# Patient Record
Sex: Male | Born: 1985 | Race: Black or African American | Hispanic: No | Smoking: Current every day smoker
Health system: Southern US, Community
[De-identification: ages and names within clinical notes are randomized; demographics above are authoritative.]

## PROBLEM LIST (undated history)

## (undated) DIAGNOSIS — J45909 Unspecified asthma, uncomplicated: Secondary | ICD-10-CM

---

## 2011-03-03 ENCOUNTER — Emergency Department (HOSPITAL_COMMUNITY): Payer: Self-pay

## 2011-03-03 ENCOUNTER — Other Ambulatory Visit: Payer: Self-pay

## 2011-03-03 ENCOUNTER — Emergency Department (HOSPITAL_COMMUNITY)
Admission: EM | Admit: 2011-03-03 | Discharge: 2011-03-03 | Disposition: A | Discharge status: Home / Self Care | Payer: Self-pay | Attending: Emergency Medicine | Admitting: Emergency Medicine

## 2011-03-03 ENCOUNTER — Encounter: Payer: Self-pay | Admitting: *Deleted

## 2011-03-03 DIAGNOSIS — R0602 Shortness of breath: Secondary | ICD-10-CM | POA: Insufficient documentation

## 2011-03-03 DIAGNOSIS — J069 Acute upper respiratory infection, unspecified: Secondary | ICD-10-CM | POA: Insufficient documentation

## 2011-03-03 DIAGNOSIS — R059 Cough, unspecified: Secondary | ICD-10-CM | POA: Insufficient documentation

## 2011-03-03 DIAGNOSIS — R079 Chest pain, unspecified: Secondary | ICD-10-CM | POA: Insufficient documentation

## 2011-03-03 DIAGNOSIS — R05 Cough: Secondary | ICD-10-CM | POA: Insufficient documentation

## 2011-03-03 LAB — CBC
HCT: 37.8 % — ABNORMAL LOW (ref 39.0–52.0)
MCH: 29.3 pg (ref 26.0–34.0)
MCHC: 34.9 g/dL (ref 30.0–36.0)
MCV: 84 fL (ref 78.0–100.0)
RDW: 12.3 % (ref 11.5–15.5)

## 2011-03-03 LAB — POCT I-STAT TROPONIN I

## 2011-03-03 LAB — POCT I-STAT, CHEM 8
Calcium, Ion: 1.11 mmol/L — ABNORMAL LOW (ref 1.12–1.32)
Chloride: 106 mEq/L (ref 96–112)
Glucose, Bld: 104 mg/dL — ABNORMAL HIGH (ref 70–99)
HCT: 39 % (ref 39.0–52.0)
TCO2: 25 mmol/L (ref 0–100)

## 2011-03-03 LAB — DIFFERENTIAL
Basophils Absolute: 0 10*3/uL (ref 0.0–0.1)
Basophils Relative: 1 % (ref 0–1)
Eosinophils Absolute: 0.2 10*3/uL (ref 0.0–0.7)
Eosinophils Relative: 2 % (ref 0–5)
Lymphocytes Relative: 38 % (ref 12–46)
Monocytes Absolute: 0.8 10*3/uL (ref 0.1–1.0)

## 2011-03-03 MED ORDER — ALBUTEROL SULFATE HFA 108 (90 BASE) MCG/ACT IN AERS
2.0000 | INHALATION_SPRAY | RESPIRATORY_TRACT | Status: DC | PRN
  Administered 2011-03-03: 2 via RESPIRATORY_TRACT
  Filled 2011-03-03: qty 6.7

## 2011-03-03 MED ORDER — PREDNISONE 20 MG PO TABS
60.0000 mg | ORAL_TABLET | Freq: Once | ORAL | Status: AC
  Administered 2011-03-03: 60 mg via ORAL
  Filled 2011-03-03: qty 3

## 2011-03-03 MED ORDER — ALBUTEROL SULFATE HFA 108 (90 BASE) MCG/ACT IN AERS: 2.0000 | INHALATION_SPRAY | RESPIRATORY_TRACT | Status: AC | PRN

## 2011-03-03 MED ORDER — POTASSIUM CHLORIDE CRYS ER 20 MEQ PO TBCR
20.0000 meq | EXTENDED_RELEASE_TABLET | Freq: Once | ORAL | Status: AC
  Administered 2011-03-03: 20 meq via ORAL
  Filled 2011-03-03: qty 1

## 2011-03-03 MED ORDER — PREDNISONE 20 MG PO TABS: 60.0000 mg | ORAL_TABLET | Freq: Every day | ORAL | Status: AC

## 2011-03-03 NOTE — ED Notes (Signed)
EKG completed and given to Dr. Dierdre Highman.

## 2011-03-03 NOTE — ED Notes (Signed)
Pt called EMS for sob (feeling like he could not get a "full" breath).  Upon EMS arrival pt stated that he had been smoking pot and that he no longer wanted to go to the hospital.  Denies cocaine or etoh abuse.  However, EMS ekg showed possible st elevations and pt decided he wanted to come to ED.  Denies pain, just complaining of sob.  Per EMS, no wheezes noted.

## 2011-03-03 NOTE — ED Provider Notes (Signed)
History     CSN: [id number] Arrival date & time: 03/03/2011  1:35 AM   First MD Initiated Contact with Patient 03/03/11 [phone]      Chief Complaint  Patient presents with  . Shortness of Breath    (Consider location/radiation/quality/duration/timing/severity/associated sxs/prior treatment) Patient is a 25 y.o. male presenting with shortness of breath. The history is provided by the patient.  Shortness of Breath  The current episode started today. The problem occurs continuously. The problem has been resolved. The problem is moderate. The symptoms are relieved by nothing. Exacerbated by: started after smoking marijuana tonight. Associated symptoms include chest pain, cough and shortness of breath. Pertinent negatives include no chest pressure, no fever, no sore throat and no wheezing. He has had no prior steroid use. He has had no prior hospitalizations. He has had no prior ICU admissions. He has had no prior intubations. His past medical history does not include asthma. Past medical history comments: Recently diagnosed bronchitis and did not finish his prescription for amoxicillin . There were no sick contacts.   substernal sharp chest pain nonradiating. Mild productive cough no hemoptysis. No leg pain or swelling. No recent travel.   History reviewed. No pertinent past medical history.  History reviewed. No pertinent past surgical history.  No family history on file.  History  Substance Use Topics  . Smoking status: Not on file  . Smokeless tobacco: Not on file  . Alcohol Use: No      Review of Systems  Constitutional: Negative for fever and chills.  HENT: Negative for sore throat, neck pain and neck stiffness.   Eyes: Negative for pain.  Respiratory: Positive for cough and shortness of breath. Negative for wheezing.   Cardiovascular: Positive for chest pain. Negative for palpitations and leg swelling.  Gastrointestinal: Negative for nausea, vomiting and abdominal pain.    Genitourinary: Negative for dysuria.  Musculoskeletal: Negative for back pain.  Skin: Negative for rash.  Neurological: Negative for headaches.  All other systems reviewed and are negative.    Allergies  Review of patient's allergies indicates no known allergies.  Home Medications   Current Outpatient Rx  Name Route Sig Dispense Refill  . TRAMADOL HCL 50 MG PO TABS Oral Take 50 mg by mouth every 6 (six) hours as needed. For pain Maximum dose= 8 tablets per day       BP 104/46  Pulse 76  Temp(Src) 98.4 F (36.9 C) (Oral)  Resp 20  Ht 6\' 3"  (1.905 m)  Wt 185 lb (83.915 kg)  BMI 23.12 kg/m2  SpO2 98%  Physical Exam  Constitutional: He is oriented to person, place, and time. He appears well-developed and well-nourished.  HENT:  Head: Normocephalic and atraumatic.  Eyes: Conjunctivae and EOM are normal. Pupils are equal, round, and reactive to light.  Neck: Trachea normal. Neck supple. No thyromegaly present.  Cardiovascular: Normal rate, regular rhythm, S1 normal, S2 normal and normal pulses.     No systolic murmur is present   No diastolic murmur is present  Pulses:      Radial pulses are 2+ on the right side, and 2+ on the left side.  Pulmonary/Chest: Effort normal and breath sounds normal. He has no wheezes. He has no rhonchi. He has no rales. He exhibits no tenderness.  Abdominal: Soft. Normal appearance and bowel sounds are normal. There is no tenderness. There is no CVA tenderness and negative Murphy's sign.  Musculoskeletal:       BLE:s Calves nontender, no cords  or erythema, negative Homans sign  Neurological: He is alert and oriented to person, place, and time. He has normal strength. No cranial nerve deficit or sensory deficit. GCS eye subscore is 4. GCS verbal subscore is 5. GCS motor subscore is 6.  Skin: Skin is warm and dry. No rash noted. He is not diaphoretic.  Psychiatric: His speech is normal.       Cooperative and appropriate    ED Course   Procedures (including critical care time)  Labs Reviewed  CBC - Abnormal; Notable for the following:    HCT 37.8 (*)    All other components within normal limits  POCT I-STAT, CHEM 8 - Abnormal; Notable for the following:    Potassium 3.4 (*)    Glucose, Bld 104 (*)    Calcium, Ion 1.11 (*)    All other components within normal limits  DIFFERENTIAL  D-DIMER, QUANTITATIVE  POCT I-STAT TROPONIN I  POCT I-STAT TROPONIN I  I-STAT, CHEM 8  I-STAT TROPONIN I  I-STAT TROPONIN I   Dg Chest 2 View  03/03/2011  *RADIOLOGY REPORT*  Clinical Data: Shortness of breath and cough.  CHEST - 2 VIEW  Comparison: 12/13/2010  Findings: The lungs are clear without focal consolidation, edema, effusion or pneumothorax.  Cardiopericardial silhouette is within normal limits for size.  Imaged bony structures of the thorax are intact. Telemetry leads overlie the chest.  IMPRESSION: Normal exam.  Original Report Authenticated By: ERIC A. MANSELL, M.D.     Date: 03/03/2011  Rate: 49  Rhythm: sinus bradycardia  QRS Axis: normal  Intervals: normal  ST/T Wave abnormalities: ST elevations diffusely  Conduction Disutrbances:none  Narrative Interpretation:   Old EKG Reviewed: none available     MDM  Cough and chest pain or shortness of breath consistent with URI, screening EKG demonstrates ST changes likely early repol. Serial troponins negative and symptoms resolved emergency department with inhaler. Chest x-ray obtained and reviewed no infiltrates or pneumothorax. Albuterol and prednisone for symptoms and prescription for same provided patient stable for discharge home.        Sunnie Nielsen, MD 03/03/11 [phone]

## 2011-03-03 NOTE — ED Notes (Signed)
Pt notified that D-Dimer has been ordered.

## 2011-03-03 NOTE — ED Notes (Signed)
Pt denies pain but states sob and chest tightness.  Lung sounds clear bil.  Denies any medical hx.

## 2011-03-03 NOTE — ED Notes (Signed)
Pt denies any pain or sob at this time.  He does not know where he came from b/c he is visiting from [place] (he has been here 2 months).  He is trying to call family.

## 2017-05-30 ENCOUNTER — Emergency Department (HOSPITAL_BASED_OUTPATIENT_CLINIC_OR_DEPARTMENT_OTHER)
Admission: EM | Admit: 2017-05-30 | Discharge: 2017-05-31 | Disposition: A | Discharge status: Home / Self Care | Payer: Self-pay | Attending: Emergency Medicine | Admitting: Emergency Medicine

## 2017-05-30 ENCOUNTER — Encounter (HOSPITAL_BASED_OUTPATIENT_CLINIC_OR_DEPARTMENT_OTHER): Payer: Self-pay | Admitting: *Deleted

## 2017-05-30 ENCOUNTER — Other Ambulatory Visit: Payer: Self-pay

## 2017-05-30 ENCOUNTER — Emergency Department (HOSPITAL_BASED_OUTPATIENT_CLINIC_OR_DEPARTMENT_OTHER): Payer: Self-pay

## 2017-05-30 DIAGNOSIS — F17210 Nicotine dependence, cigarettes, uncomplicated: Secondary | ICD-10-CM | POA: Insufficient documentation

## 2017-05-30 DIAGNOSIS — R222 Localized swelling, mass and lump, trunk: Secondary | ICD-10-CM | POA: Insufficient documentation

## 2017-05-30 DIAGNOSIS — R51 Headache: Secondary | ICD-10-CM | POA: Insufficient documentation

## 2017-05-30 DIAGNOSIS — F1210 Cannabis abuse, uncomplicated: Secondary | ICD-10-CM | POA: Insufficient documentation

## 2017-05-30 DIAGNOSIS — R0981 Nasal congestion: Secondary | ICD-10-CM | POA: Insufficient documentation

## 2017-05-30 DIAGNOSIS — R0602 Shortness of breath: Secondary | ICD-10-CM

## 2017-05-30 LAB — CBC WITH DIFFERENTIAL/PLATELET
BASOS ABS: 0.1 10*3/uL (ref 0.0–0.1)
BASOS PCT: 1 %
Eosinophils Absolute: 0.1 10*3/uL (ref 0.0–0.7)
Eosinophils Relative: 2 %
HEMATOCRIT: 43.1 % (ref 39.0–52.0)
HEMOGLOBIN: 14.6 g/dL (ref 13.0–17.0)
Lymphocytes Relative: 42 %
Lymphs Abs: 2.9 10*3/uL (ref 0.7–4.0)
MCH: 29.9 pg (ref 26.0–34.0)
MCHC: 33.9 g/dL (ref 30.0–36.0)
MCV: 88.1 fL (ref 78.0–100.0)
Monocytes Absolute: 0.6 10*3/uL (ref 0.1–1.0)
Monocytes Relative: 9 %
NEUTROS ABS: 3.2 10*3/uL (ref 1.7–7.7)
NEUTROS PCT: 46 %
Platelets: 229 10*3/uL (ref 150–400)
RBC: 4.89 MIL/uL (ref 4.22–5.81)
RDW: 12.8 % (ref 11.5–15.5)
WBC: 6.9 10*3/uL (ref 4.0–10.5)

## 2017-05-30 LAB — BASIC METABOLIC PANEL
ANION GAP: 8 (ref 5–15)
BUN: 9 mg/dL (ref 6–20)
CHLORIDE: 103 mmol/L (ref 101–111)
CO2: 28 mmol/L (ref 22–32)
Calcium: 8.9 mg/dL (ref 8.9–10.3)
Creatinine, Ser: 1.01 mg/dL (ref 0.61–1.24)
GFR calc non Af Amer: >60 mL/min (ref >60)
Glucose, Bld: 83 mg/dL (ref 65–99)
POTASSIUM: 3.8 mmol/L (ref 3.5–5.1)
Sodium: 139 mmol/L (ref 135–145)

## 2017-05-30 MED ORDER — ALBUTEROL SULFATE HFA 108 (90 BASE) MCG/ACT IN AERS
2.0000 | INHALATION_SPRAY | RESPIRATORY_TRACT | Status: DC | PRN
  Administered 2017-05-31: 2 via RESPIRATORY_TRACT
  Filled 2017-05-30: qty 6.7

## 2017-05-30 NOTE — ED Provider Notes (Signed)
[facility] EMERGENCY DEPARTMENT Provider Note   CSN: [id number] Arrival date & time: 05/30/17  1911     History   Chief Complaint No chief complaint on file.   HPI Dvontae Ruan is a 32 y.o. male.  Patient is a 32 year old male who presents with multiple symptoms.  He states over the last month or so he has had some intermittent symptoms of shortness of breath.  He feels like he is having some palpitations at times.  He does have a prior history of asthma and feels like it might be related to that although he does not note any definitive wheezing.  He does have some nasal congestion at times and has some intermittent bifrontal type headaches.  No fevers.  No difficulty with his balance.  He has no symptoms when exercising.  He states he is able to play basketball without any shortness of breath or chest pain.  He also notes that he has a lump on his left chest wall that is been there several months.  He does not notice any enlargement.  It is not tender.      History reviewed. No pertinent past medical history.  There are no active problems to display for this patient.   History reviewed. No pertinent surgical history.     Home Medications    Prior to Admission medications   Medication Sig Start Date End Date Taking? Authorizing Provider  albuterol (PROVENTIL HFA;VENTOLIN HFA) 108 (90 BASE) MCG/ACT inhaler Inhale 2 puffs into the lungs every 4 (four) hours as needed for wheezing. 03/03/11 03/02/12  Sunnie Nielsen, MD  traMADol (ULTRAM) 50 MG tablet Take 50 mg by mouth every 6 (six) hours as needed. For pain Maximum dose= 8 tablets per day     [provider]    Family History No family history on file.  Social History Social History   Tobacco Use  . Smoking status: Current Every Day Smoker    Years: 5.00  . Smokeless tobacco: Never Used  Substance Use Topics  . Alcohol use: No  . Drug use: Yes    Types: Marijuana     Allergies   Patient  has no known allergies.   Review of Systems Review of Systems  Constitutional: Negative for chills, diaphoresis, fatigue and fever.  HENT: Positive for congestion. Negative for rhinorrhea and sneezing.   Eyes: Negative.   Respiratory: Positive for shortness of breath. Negative for cough and chest tightness.   Cardiovascular: Positive for palpitations. Negative for chest pain and leg swelling.  Gastrointestinal: Negative for abdominal pain, blood in stool, diarrhea, nausea and vomiting.  Genitourinary: Negative for difficulty urinating, flank pain, frequency and hematuria.  Musculoskeletal: Negative for arthralgias and back pain.  Skin: Negative for rash.  Neurological: Positive for headaches. Negative for dizziness, speech difficulty, weakness and numbness.     Physical Exam Updated Vital Signs BP 137/85 (BP Location: Left Arm)   Pulse (!) 59   Temp 98.4 F (36.9 C) (Oral)   Resp 15   Ht 6\' 4"  (1.93 m)   Wt 86.2 kg (190 lb)   SpO2 99%   BMI 23.13 kg/m   Physical Exam  Constitutional: He is oriented to person, place, and time. He appears well-developed and well-nourished.  HENT:  Head: Normocephalic and atraumatic.  Mouth/Throat: Oropharynx is clear and moist.  Eyes: Pupils are equal, round, and reactive to light.  Neck: Normal range of motion. Neck supple.  Cardiovascular: Normal rate, regular rhythm and normal heart  sounds.  Patient is a 2 cm soft mobile mass to his left chest wall just lateral to the sternum.  There is no erythema.  No tenderness.  Pulmonary/Chest: Effort normal and breath sounds normal. No respiratory distress. He has no wheezes. He has no rales. He exhibits no tenderness.  Abdominal: Soft. Bowel sounds are normal. There is no tenderness. There is no rebound and no guarding.  Musculoskeletal: Normal range of motion. He exhibits no edema.  No edema or calf tenderness  Lymphadenopathy:    He has no cervical adenopathy.  Neurological: He is alert and  oriented to person, place, and time.  Skin: Skin is warm and dry. No rash noted.  Psychiatric: He has a normal mood and affect.     ED Treatments / Results  Labs (all labs ordered are listed, but only abnormal results are displayed) Labs Reviewed  CBC WITH DIFFERENTIAL/PLATELET  BASIC METABOLIC PANEL    EKG  EKG Interpretation None     ED ECG REPORT   Date: 05/30/2017  Rate: 75  Rhythm: normal sinus rhythm  QRS Axis: normal  Intervals: normal  ST/T Wave abnormalities: ST elevations diffusely, similar to prior EKG  Conduction Disutrbances:none  Narrative Interpretation:   Old EKG Reviewed: unchanged  I have personally reviewed the EKG tracing and agree with the computerized printout as noted.   Radiology Dg Chest 2 View  Result Date: 05/30/2017 CLINICAL DATA:  Cough and dyspnea x1 day. EXAM: CHEST - 2 VIEW COMPARISON:  04/19/2016 FINDINGS: The heart size and mediastinal contours are within normal limits. Both lungs are clear. The visualized skeletal structures are unremarkable. IMPRESSION: No active cardiopulmonary disease. Electronically Signed   By: Tollie Eth M.D.   On: 05/30/2017 23:25    Procedures Procedures (including critical care time)  Medications Ordered in ED Medications  albuterol (PROVENTIL HFA;VENTOLIN HFA) 108 (90 Base) MCG/ACT inhaler 2 puff (not administered)     Initial Impression / Assessment and Plan / ED Course  I have reviewed the triage vital signs and the nursing notes.  Pertinent labs & imaging results that were available during my care of the patient were reviewed by me and considered in my medical decision making (see chart for details).     Patient is a 32 year old male who presents with various complaints.  He has a lump on his chest wall.  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  It is nontender.  There is no suggestions of infection.  I did advise him that I can refer him to [facility] [place] [facility] if  he should want it removed or if it becomes painful or enlarging.  He also complains of some intermittent symptoms of shortness of breath.  It is not exertional.  He has some palpitations at times.  He does not have any abnormal findings on EKG.  Chest x-ray is clear without suggestions of pneumonia.  His labs are within normal limits.  He does have a prior history of asthma and he smokes.  He may have some degree of reactive airway disease.  I dispensed some albuterol inhaler to use.  His lungs are currently clear.  He denies any current symptoms.  He has no hypoxia or tachycardia.  No other suggestions of pulmonary embolus.  No evidence of pneumonia.  He was discharged home in good condition.  He was encouraged to establish care with a primary care physician.  Return precautions were given.  Final Clinical Impressions(s) / ED  Diagnoses   Final diagnoses:  Chest wall mass  Shortness of breath    ED Discharge Orders    None       Rolan BuccoBelfi, Vincenzo Stave, MD 05/30/17 2353

## 2017-05-30 NOTE — ED Triage Notes (Signed)
Knot in his left chest for a month. He was seen at [facility] when he first found it and they told him it was a benign tumor. He was not given follow up. He wants a second opinion.

## 2019-04-07 IMAGING — CR DG CHEST 2V
2 series · 2 of 2 positions shown · non-contrast
Comparison: 04/19/2016

CLINICAL DATA: Cough and dyspnea x1 day.

EXAM:
CHEST - 2 VIEW

[w chest pa]
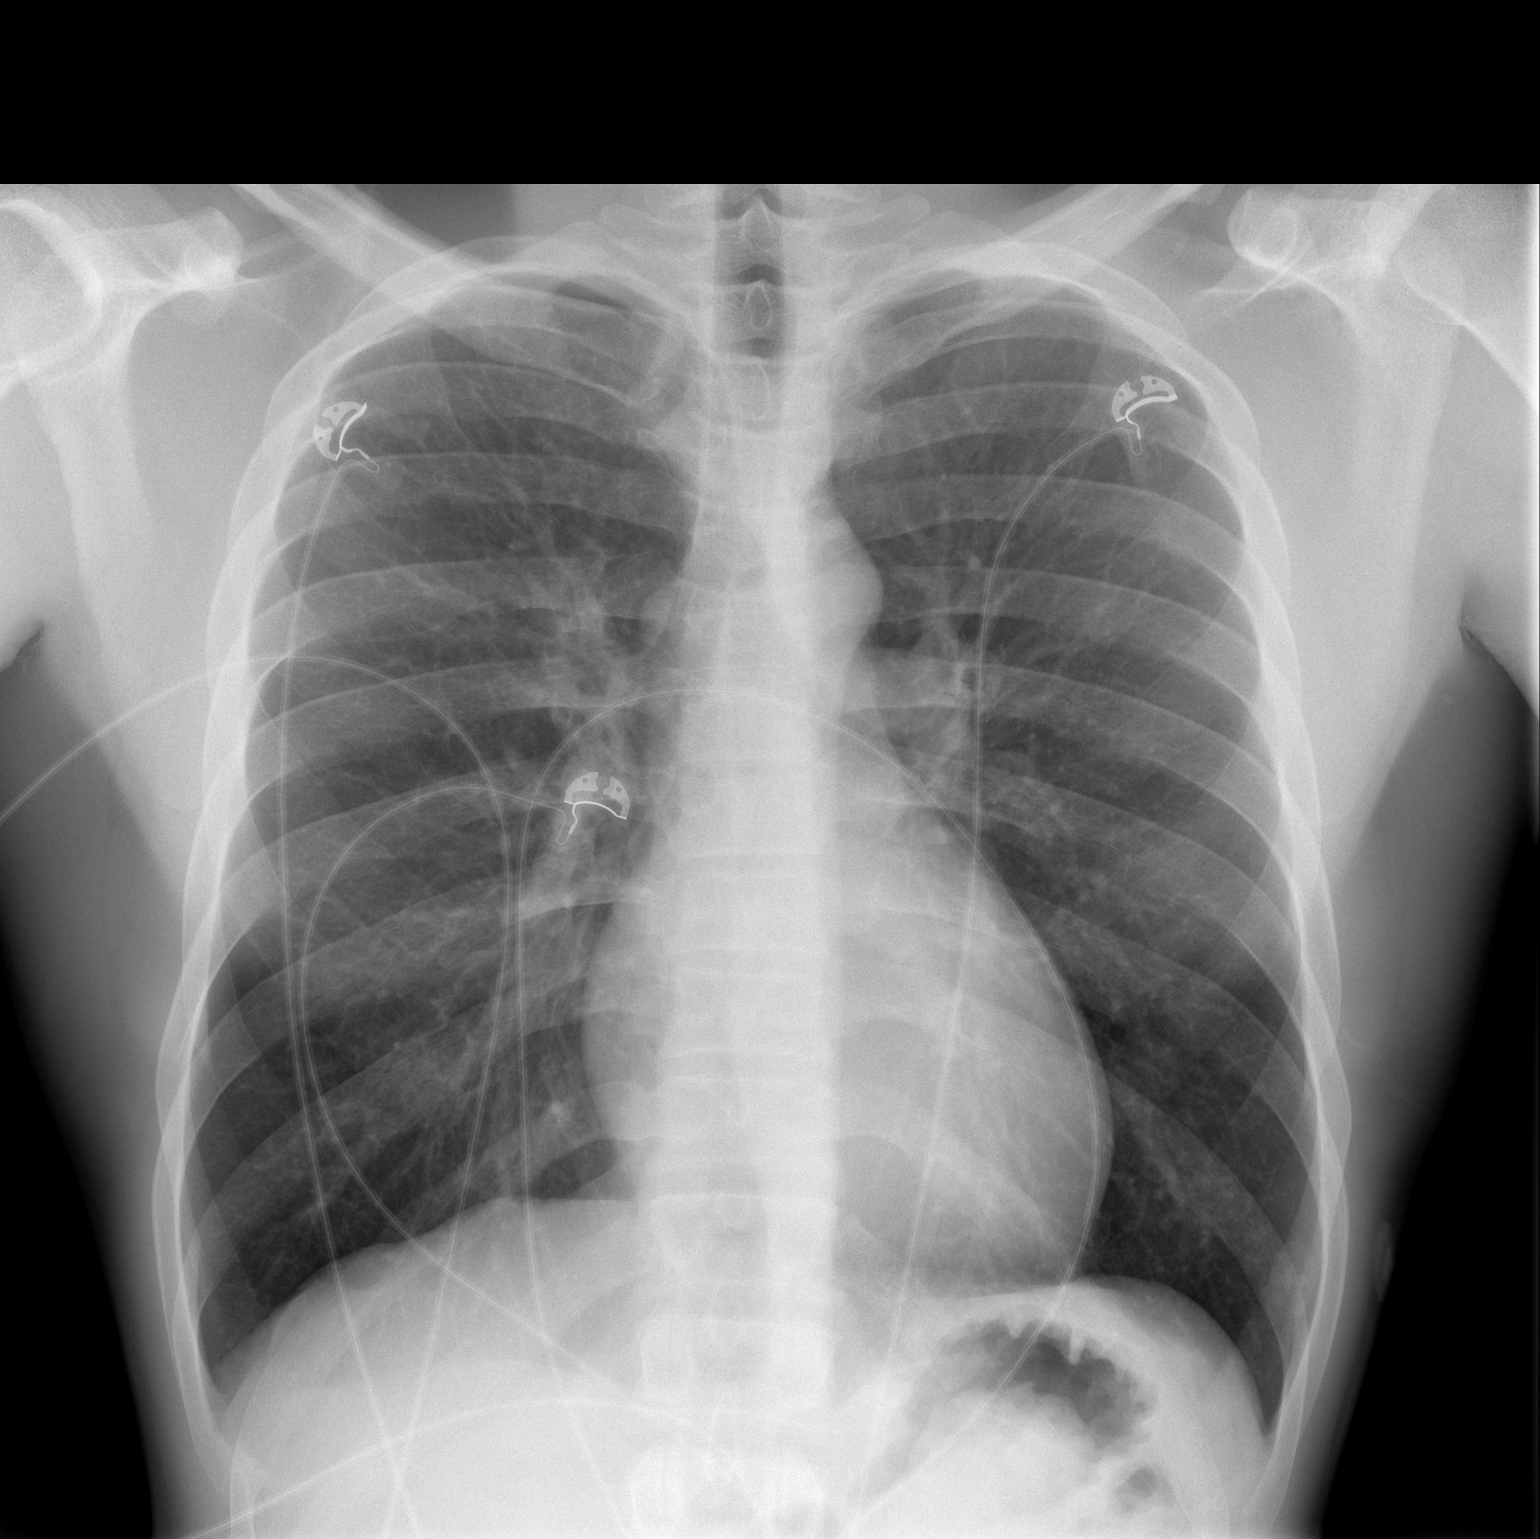

[w chest lat]
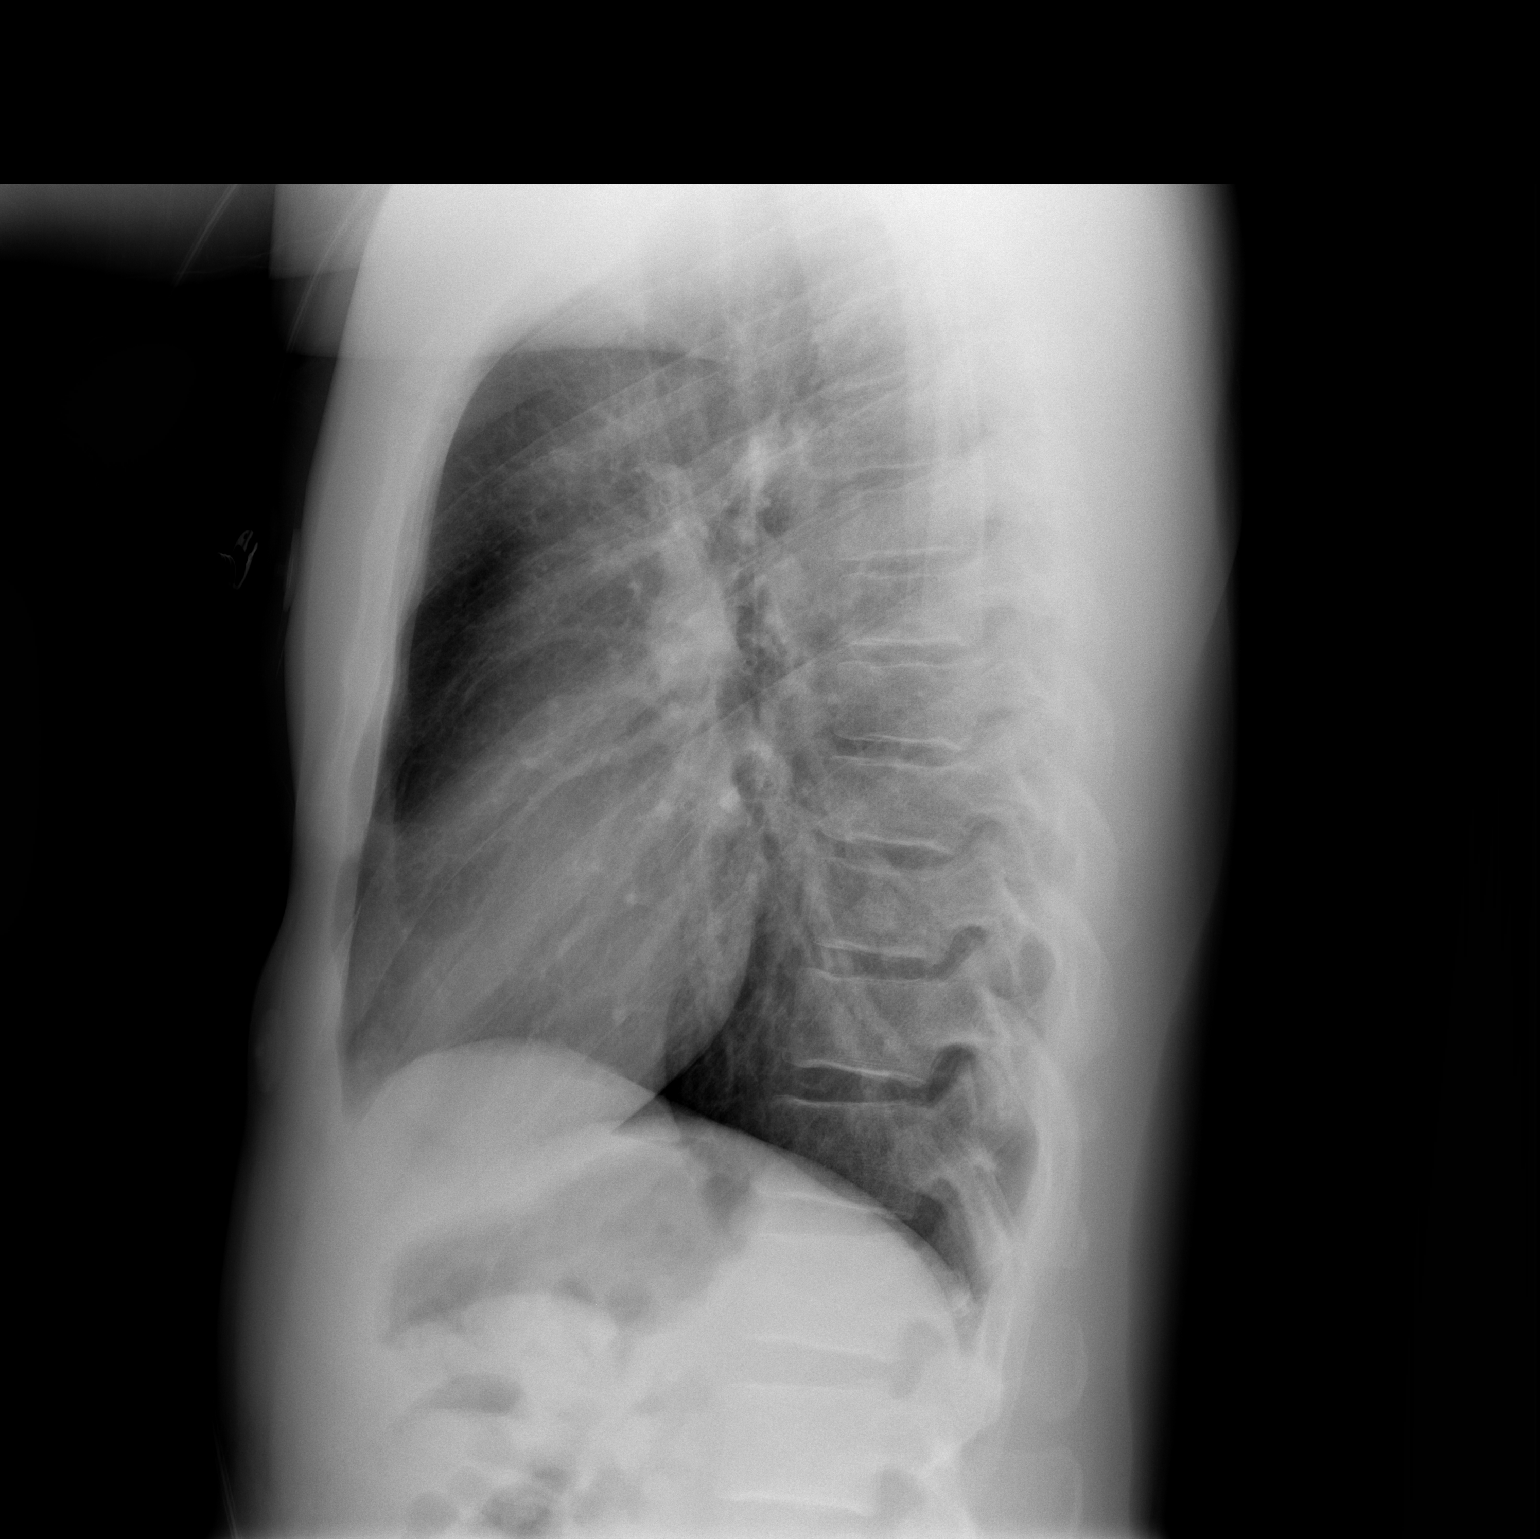

[2 of 2 positions shown; findings below may reference images not displayed]

FINDINGS: The heart size and mediastinal contours are within normal limits.
Both lungs are clear. The visualized skeletal structures are
unremarkable.
IMPRESSION: No active cardiopulmonary disease.

## 2020-08-10 ENCOUNTER — Emergency Department (HOSPITAL_BASED_OUTPATIENT_CLINIC_OR_DEPARTMENT_OTHER): Payer: Self-pay

## 2020-08-10 ENCOUNTER — Encounter (HOSPITAL_BASED_OUTPATIENT_CLINIC_OR_DEPARTMENT_OTHER): Payer: Self-pay

## 2020-08-10 ENCOUNTER — Emergency Department (HOSPITAL_BASED_OUTPATIENT_CLINIC_OR_DEPARTMENT_OTHER)
Admission: EM | Admit: 2020-08-10 | Discharge: 2020-08-10 | Disposition: A | Discharge status: Home / Self Care | Payer: Self-pay | Attending: Emergency Medicine | Admitting: Emergency Medicine

## 2020-08-10 ENCOUNTER — Other Ambulatory Visit: Payer: Self-pay

## 2020-08-10 DIAGNOSIS — F419 Anxiety disorder, unspecified: Secondary | ICD-10-CM | POA: Insufficient documentation

## 2020-08-10 DIAGNOSIS — M940 Chondrocostal junction syndrome [Tietze]: Secondary | ICD-10-CM | POA: Insufficient documentation

## 2020-08-10 DIAGNOSIS — M79602 Pain in left arm: Secondary | ICD-10-CM | POA: Insufficient documentation

## 2020-08-10 DIAGNOSIS — R0602 Shortness of breath: Secondary | ICD-10-CM | POA: Insufficient documentation

## 2020-08-10 DIAGNOSIS — F17210 Nicotine dependence, cigarettes, uncomplicated: Secondary | ICD-10-CM | POA: Insufficient documentation

## 2020-08-10 DIAGNOSIS — H9313 Tinnitus, bilateral: Secondary | ICD-10-CM | POA: Insufficient documentation

## 2020-08-10 DIAGNOSIS — R202 Paresthesia of skin: Secondary | ICD-10-CM | POA: Insufficient documentation

## 2020-08-10 DIAGNOSIS — J45909 Unspecified asthma, uncomplicated: Secondary | ICD-10-CM | POA: Insufficient documentation

## 2020-08-10 DIAGNOSIS — R61 Generalized hyperhidrosis: Secondary | ICD-10-CM | POA: Insufficient documentation

## 2020-08-10 HISTORY — DX: Unspecified asthma, uncomplicated: J45.909

## 2020-08-10 LAB — CBC WITH DIFFERENTIAL/PLATELET
Abs Immature Granulocytes: 0.01 10*3/uL (ref 0.00–0.07)
Basophils Absolute: 0.1 10*3/uL (ref 0.0–0.1)
Basophils Relative: 1 %
Eosinophils Absolute: 0.1 10*3/uL (ref 0.0–0.5)
Eosinophils Relative: 1 %
HCT: 44.9 % (ref 39.0–52.0)
Hemoglobin: 15.5 g/dL (ref 13.0–17.0)
Immature Granulocytes: 0 %
Lymphocytes Relative: 37 %
Lymphs Abs: 1.6 10*3/uL (ref 0.7–4.0)
MCH: 30.9 pg (ref 26.0–34.0)
MCHC: 34.5 g/dL (ref 30.0–36.0)
MCV: 89.6 fL (ref 80.0–100.0)
Monocytes Absolute: 0.5 10*3/uL (ref 0.1–1.0)
Monocytes Relative: 12 %
Neutro Abs: 2.1 10*3/uL (ref 1.7–7.7)
Neutrophils Relative %: 49 %
Platelets: 220 10*3/uL (ref 150–400)
RBC: 5.01 MIL/uL (ref 4.22–5.81)
RDW: 12.5 % (ref 11.5–15.5)
WBC: 4.3 10*3/uL (ref 4.0–10.5)
nRBC: 0 % (ref 0.0–0.2)

## 2020-08-10 LAB — TROPONIN I (HIGH SENSITIVITY): Troponin I (High Sensitivity): <2 ng/L (ref <18)

## 2020-08-10 LAB — COMPREHENSIVE METABOLIC PANEL
ALT: 32 U/L (ref 0–44)
AST: 35 U/L (ref 15–41)
Albumin: 4.4 g/dL (ref 3.5–5.0)
Alkaline Phosphatase: 58 U/L (ref 38–126)
Anion gap: 8 (ref 5–15)
BUN: 11 mg/dL (ref 6–20)
CO2: 28 mmol/L (ref 22–32)
Calcium: 9.1 mg/dL (ref 8.9–10.3)
Chloride: 100 mmol/L (ref 98–111)
Creatinine, Ser: 0.98 mg/dL (ref 0.61–1.24)
GFR, Estimated: >60 mL/min (ref >60)
Glucose, Bld: 118 mg/dL — ABNORMAL HIGH (ref 70–99)
Potassium: 3.6 mmol/L (ref 3.5–5.1)
Sodium: 136 mmol/L (ref 135–145)
Total Bilirubin: 0.6 mg/dL (ref 0.3–1.2)
Total Protein: 7.6 g/dL (ref 6.5–8.1)

## 2020-08-10 LAB — D-DIMER, QUANTITATIVE: D-Dimer, Quant: <0.27 ug/mL-FEU (ref 0.00–0.50)

## 2020-08-10 MED ORDER — NAPROXEN 500 MG PO TABS: 500.0000 mg | ORAL_TABLET | Freq: Two times a day (BID) | ORAL | 0 refills | Status: AC

## 2020-08-10 MED ORDER — NAPROXEN 500 MG PO TABS: 500.0000 mg | ORAL_TABLET | Freq: Two times a day (BID) | ORAL | 0 refills | Status: DC

## 2020-08-10 NOTE — ED Provider Notes (Signed)
[facility] EMERGENCY DEPARTMENT Provider Note   CSN: [id number] Arrival date & time: 08/10/20  1926     History Chief Complaint  Patient presents with  . Chest Pain    Don Wheeler is a 35 y.o. male with past medical history significant for asthma and lipoma of left chest wall who presents to [facility] ED for evaluation of multiple complaints.  Patient states that for the past several weeks that he has been experiencing episodic shortness of breath. He states that the symptoms worsen only with smoking cigarettes. He has been smoking approximately 1.5 packs of cigarettes per day and smoking two grams of marijuana daily. He describes an associated sensation of feeling tightness in his chest if he takes a deep breath when symptoms present. In addition, patient describes on and off symptoms of left ear pressure, ringing in his ears, sweating, tingling in his fingertips, occassional left arm pain, a knot in his left arm, knot in his chest, and concern for a lesion of his left lower leg.   Past Medical History:  Diagnosis Date  . Asthma    There are no problems to display for this patient.  History reviewed. No pertinent surgical history.  No family history on file.  Social History   Tobacco Use  . Smoking status: Current Every Day Smoker    Years: 5.00    Types: Cigarettes  . Smokeless tobacco: Never Used  Substance Use Topics  . Alcohol use: Yes    Comment: daily  . Drug use: Yes    Types: Marijuana    Home Medications Prior to Admission medications   Medication Sig Start Date End Date Taking? Authorizing Provider  naproxen (NAPROSYN) 500 MG tablet Take 1 tablet (500 mg total) by mouth 2 (two) times daily with a meal. 08/10/20 09/09/20 Yes Roylene Reason, MD  albuterol (PROVENTIL HFA;VENTOLIN HFA) 108 (90 BASE) MCG/ACT inhaler Inhale 2 puffs into the lungs every 4 (four) hours as needed for wheezing. 03/03/11 03/02/12  Sunnie Nielsen, MD  traMADol  (ULTRAM) 50 MG tablet Take 50 mg by mouth every 6 (six) hours as needed. For pain Maximum dose= 8 tablets per day     [provider]    Allergies    Patient has no known allergies.  Review of Systems   Review of Systems  Constitutional: Negative for chills and fever.  HENT: Positive for ear pain and tinnitus. Negative for sore throat.   Eyes: Negative for pain and visual disturbance.  Respiratory: Positive for chest tightness and shortness of breath. Negative for cough.   Cardiovascular: Negative for chest pain and palpitations.  Gastrointestinal: Positive for vomiting. Negative for abdominal pain.  Genitourinary: Negative for dysuria and hematuria.  Musculoskeletal: Negative for arthralgias and back pain.  Skin: Negative for color change and rash.  Neurological: Positive for numbness. Negative for seizures and syncope.  All other systems reviewed and are negative.  Physical Exam Updated Vital Signs BP 125/73 (BP Location: Left Arm)   Pulse 73   Temp 98.1 F (36.7 C) (Oral)   Resp 16   Ht 6\' 4"  (1.93 m)   Wt 86.6 kg   SpO2 99%   BMI 23.25 kg/m   Physical Exam Vitals and nursing note reviewed.  Constitutional:      Appearance: He is well-developed.  HENT:     Head: Normocephalic and atraumatic.  Eyes:     Conjunctiva/sclera: Conjunctivae normal.  Cardiovascular:     Rate and Rhythm: Normal rate  and regular rhythm.     Heart sounds: Normal heart sounds. No murmur heard.   Pulmonary:     Effort: Pulmonary effort is normal. No respiratory distress.     Breath sounds: Normal breath sounds. No wheezing.  Chest:     Chest wall: Tenderness present.     Comments: Tenderness to palpation of right second costochondral joint Abdominal:     General: Bowel sounds are normal.     Palpations: Abdomen is soft.     Tenderness: There is no abdominal tenderness.  Musculoskeletal:     Cervical back: Neck supple.  Skin:    General: Skin is warm and dry.     Comments:  Well-circumscribed, hyperpigmented papule on left lower extremity.  Neurological:     General: No focal deficit present.     Mental Status: He is alert and oriented to person, place, and time. Mental status is at baseline.     Cranial Nerves: Cranial nerves are intact.     Sensory: No sensory deficit.     Motor: No weakness.  Psychiatric:        Mood and Affect: Mood is anxious.    ED Results / Procedures / Treatments   Labs (all labs ordered are listed, but only abnormal results are displayed) Labs Reviewed  COMPREHENSIVE METABOLIC PANEL - Abnormal; Notable for the following components:      Result Value   Glucose, Bld 118 (*)    All other components within normal limits  CBC WITH DIFFERENTIAL/PLATELET  D-DIMER, QUANTITATIVE  TROPONIN I (HIGH SENSITIVITY)  TROPONIN I (HIGH SENSITIVITY)   EKG EKG Interpretation  Date/Time:  Monday Aug 10 2020 19:34:43 EDT Ventricular Rate:  79 PR Interval:  154 QRS Duration: 85 QT Interval:  360 QTC Calculation: 413 R Axis:   80 Text Interpretation: Sinus rhythm  early repolarization similar to prior tracings Confirmed by Alona Bene [phone]) on 08/10/2020 7:37:08 PM  Radiology DG Chest Portable 1 View  Result Date: 08/10/2020 CLINICAL DATA:  Chest pain for 1 week, reporting palpable knot EXAM: PORTABLE CHEST 1 VIEW COMPARISON:  Radiograph 05/30/2017 FINDINGS: No consolidation, features of edema, pneumothorax, or effusion. Pulmonary vascularity is normally distributed. The cardiomediastinal contours are unremarkable. No acute osseous or soft tissue abnormality. Telemetry leads overlie the chest. IMPRESSION: No acute cardiopulmonary abnormality. Electronically Signed   By: Kreg Shropshire M.D.   On: 08/10/2020 20:52    Medications Ordered in ED Medications - No data to display  ED Course  I have reviewed the triage vital signs and the nursing notes.  Pertinent labs & imaging results that were available during my care of the patient were  reviewed by me and considered in my medical decision making (see chart for details).    MDM Rules/Calculators/A&P                          Patient presenting for evaluation of multiple complaints most notable for shortness of breath and chest tightness with deep inspiration. Patient hemodynamically stable on presentation and saturating at 100% on room air. Will obtain troponin to evaluate for cardiac etiology to explain symptoms, D-dimer to evaluate for pulmonary embolism and CBC to evaluate for infectious etiology. Will obtain chest x-ray for visualization and comparison to prior radiograph from 2019. Patient does have prior recorded diagnosis of asthma (based on symptoms, not PFTs) although he has no wheezing on examination. Additionally, patient does have tenderness to palpation of isolated costochondral joint which  raises concern for costochondritis.  Patient's CBC and CMP are unremarkable with negative d-dimer and troponin of less than 2. Patient's symptoms likely secondary to costochondritis. Regardless, patient would benefit from trial of NSAIDs, cessation of tobacco and marijuana, and establishing care with PCP. We will recommend [facility] for follow-up.  Final Clinical Impression(s) / ED Diagnoses Final diagnoses:  Acute costochondritis   Rx / DC Orders ED Discharge Orders         Ordered    naproxen (NAPROSYN) 500 MG tablet  2 times daily with meals        08/10/20 2144           Roylene Reason, MD 08/10/20 2146    Maia Plan, MD 08/13/20 [phone]

## 2020-08-10 NOTE — Discharge Instructions (Addendum)
Don Wheeler,  It was a pleasure meeting you in the emergency department. Your symptoms of chest tightness, knot in your chest and pain with palpation raise concern for costochondritis. We recommend a trial of NSAIDs with naproxen twice daily. We also recommend that you establish care with a primary care physician such as [facility]. Please work up on cutting back on smoking cigarettes and marijuana.  Sincerely, Dr. Jasmine December, MD

## 2020-08-10 NOTE — ED Triage Notes (Signed)
Pt c/o left CP x 1 week-also c/o "knot to chest"-NAD-steady gait

## 2022-06-18 IMAGING — DX DG CHEST 1V PORT
2 series · 2 of 2 positions shown · non-contrast
Comparison: Radiograph 05/30/2017

CLINICAL DATA: Chest pain for 1 week, reporting palpable knot

EXAM:
PORTABLE CHEST 1 VIEW

[chest ap (1 of 2)]
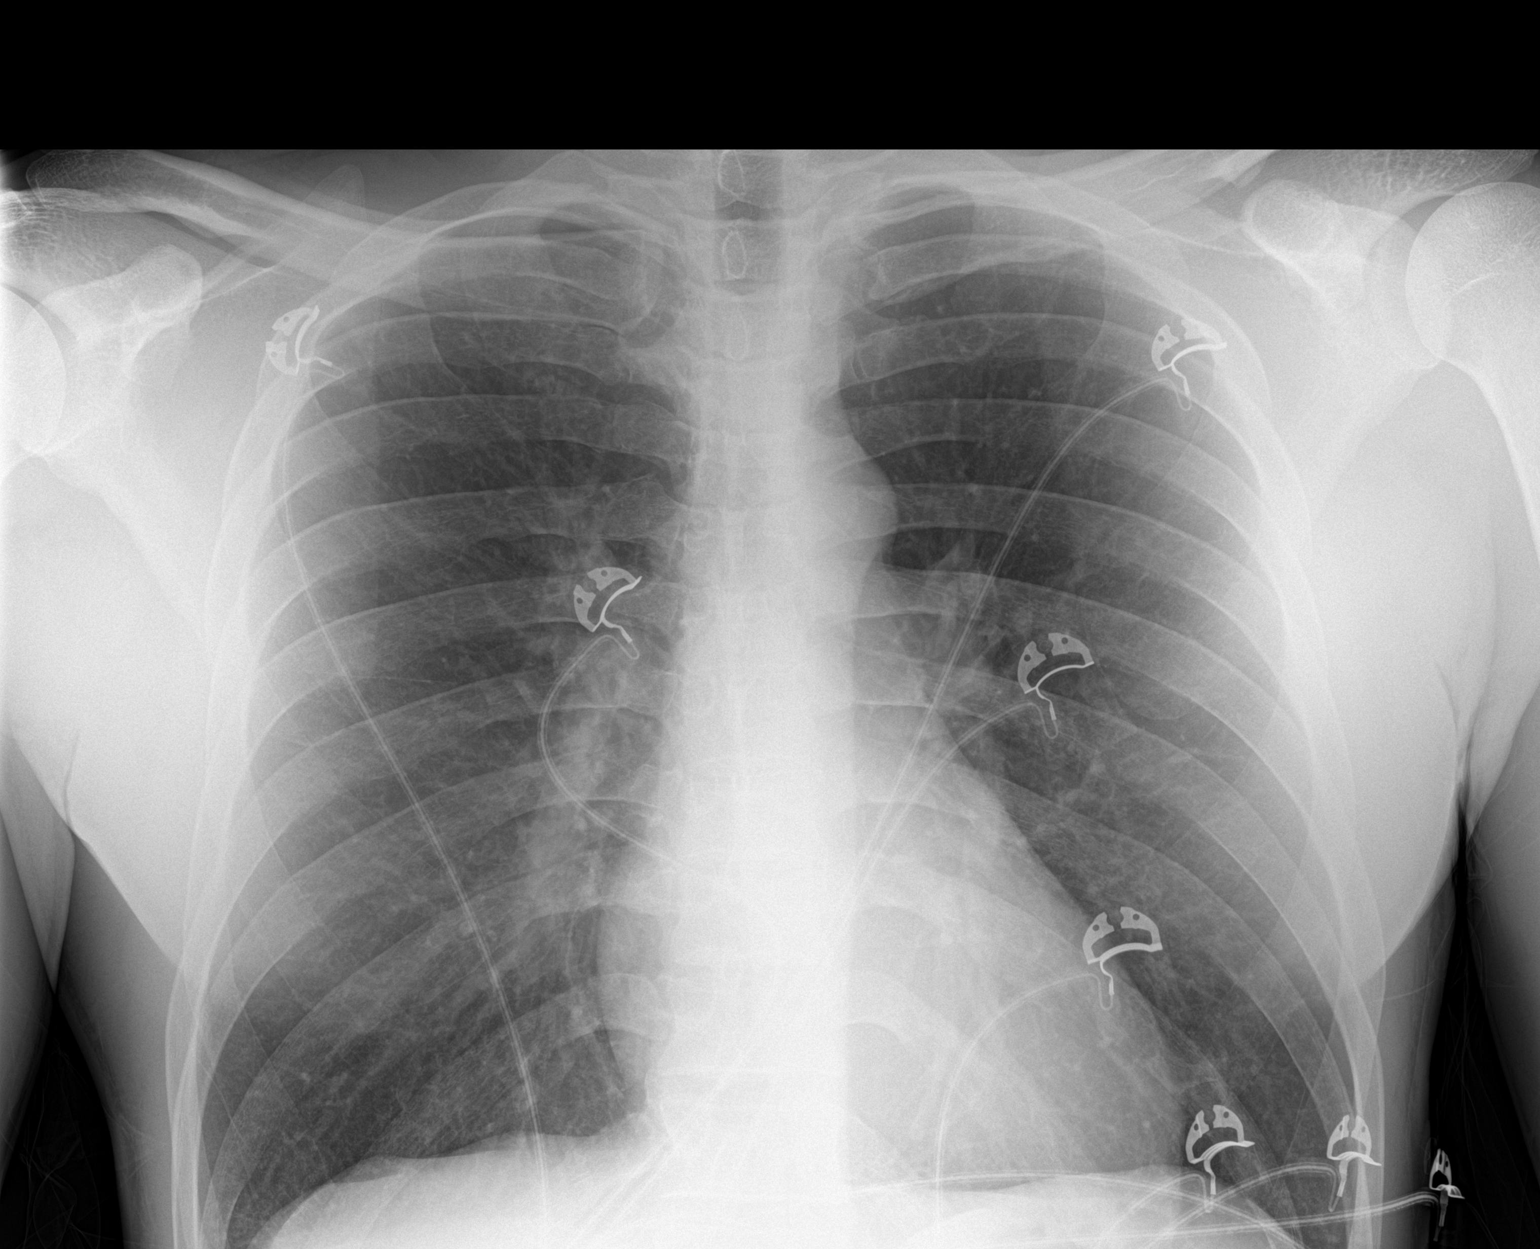

[chest ap (2 of 2)]
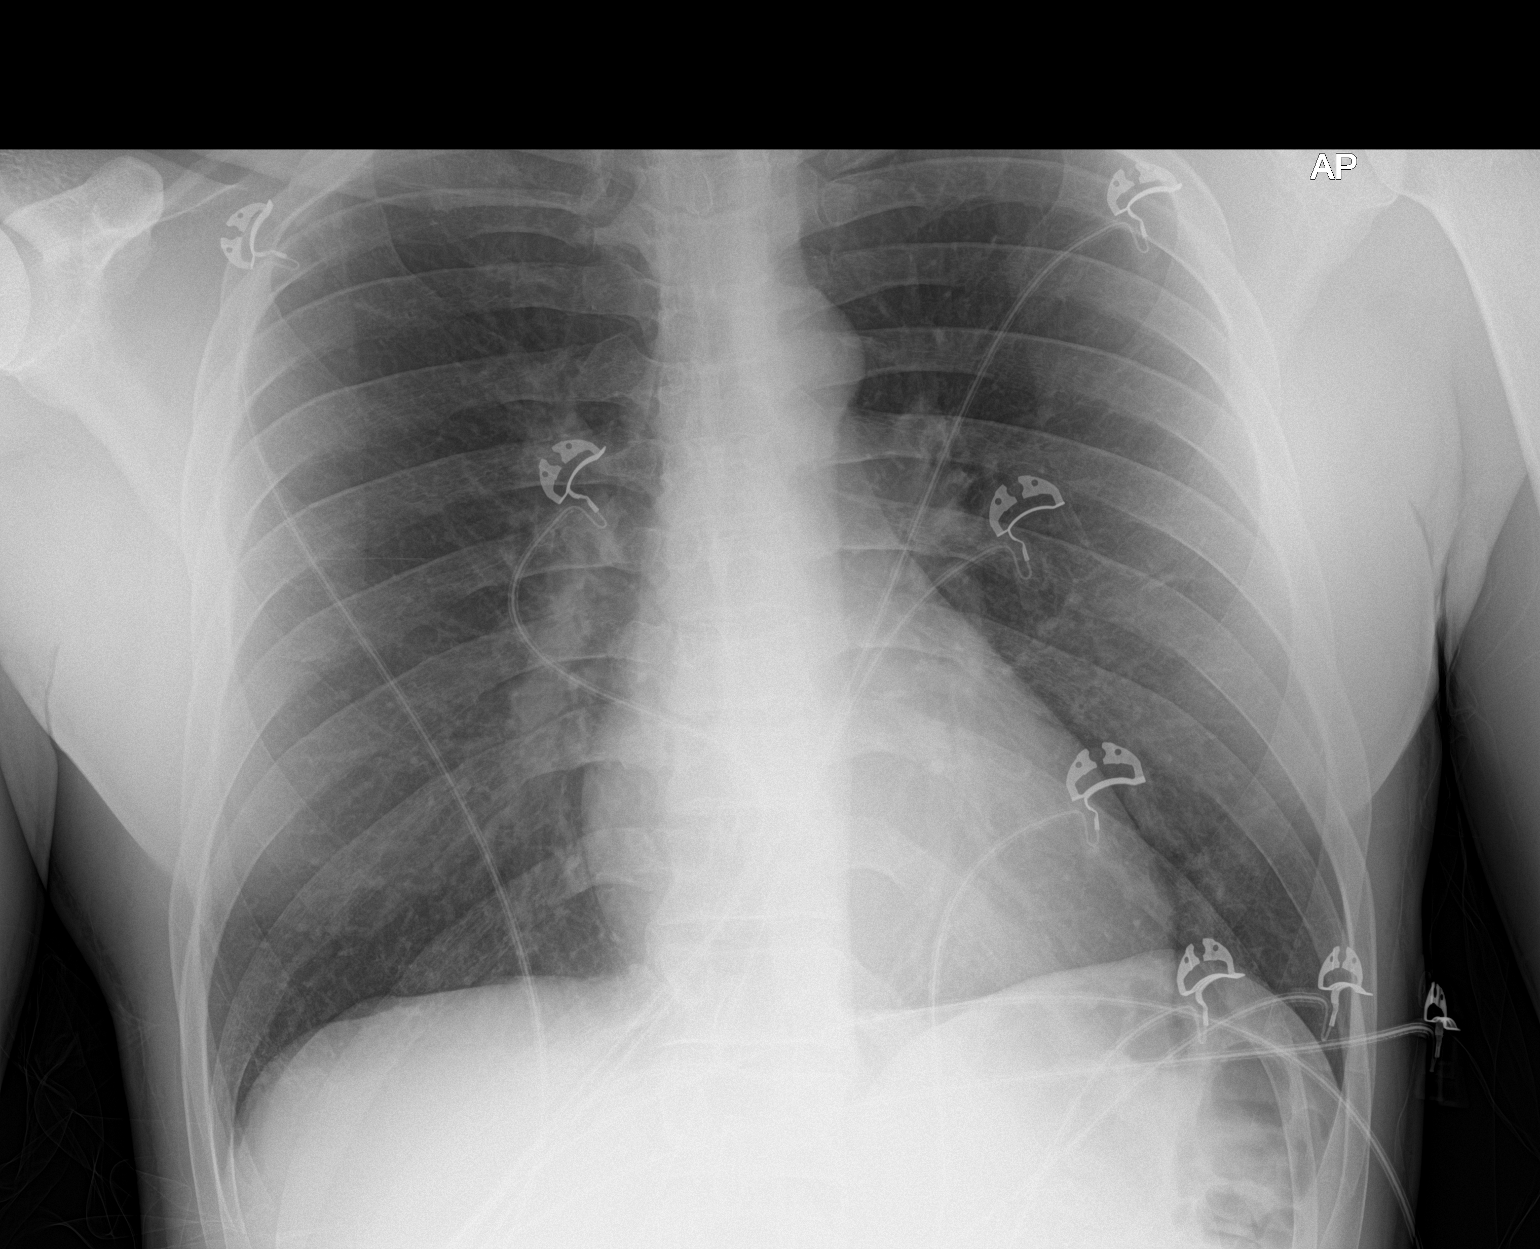

[2 of 2 positions shown; findings below may reference images not displayed]

FINDINGS: No consolidation, features of edema, pneumothorax, or effusion.
Pulmonary vascularity is normally distributed. The cardiomediastinal
contours are unremarkable. No acute osseous or soft tissue
abnormality. Telemetry leads overlie the chest.
IMPRESSION: No acute cardiopulmonary abnormality.
